# Patient Record
Sex: Male | Born: 1960 | Race: White | Hispanic: No | Marital: Married | Smoking: Never smoker
Health system: Southern US, Community
[De-identification: ages and names within clinical notes are randomized; demographics above are authoritative.]

## PROBLEM LIST (undated history)

## (undated) DIAGNOSIS — C439 Malignant melanoma of skin, unspecified: Secondary | ICD-10-CM

## (undated) DIAGNOSIS — I10 Essential (primary) hypertension: Secondary | ICD-10-CM

---

## 2016-06-09 ENCOUNTER — Emergency Department (HOSPITAL_COMMUNITY): Payer: Worker's Compensation

## 2016-06-09 ENCOUNTER — Emergency Department (HOSPITAL_COMMUNITY)
Admission: EM | Admit: 2016-06-09 | Discharge: 2016-06-09 | Disposition: A | Discharge status: Home / Self Care | Payer: Worker's Compensation | Attending: Emergency Medicine | Admitting: Emergency Medicine

## 2016-06-09 ENCOUNTER — Ambulatory Visit (HOSPITAL_COMMUNITY)
Admission: EM | Admit: 2016-06-09 | Discharge: 2016-06-09 | Disposition: A | Discharge status: Nursing Home (Includes ICF) | Payer: Worker's Compensation

## 2016-06-09 ENCOUNTER — Encounter (HOSPITAL_COMMUNITY): Payer: Self-pay | Admitting: *Deleted

## 2016-06-09 DIAGNOSIS — Z79899 Other long term (current) drug therapy: Secondary | ICD-10-CM | POA: Insufficient documentation

## 2016-06-09 DIAGNOSIS — Y929 Unspecified place or not applicable: Secondary | ICD-10-CM | POA: Insufficient documentation

## 2016-06-09 DIAGNOSIS — S301XXA Contusion of abdominal wall, initial encounter: Secondary | ICD-10-CM | POA: Diagnosis not present

## 2016-06-09 DIAGNOSIS — Y939 Activity, unspecified: Secondary | ICD-10-CM | POA: Diagnosis not present

## 2016-06-09 DIAGNOSIS — R1011 Right upper quadrant pain: Secondary | ICD-10-CM

## 2016-06-09 DIAGNOSIS — W1839XA Other fall on same level, initial encounter: Secondary | ICD-10-CM | POA: Diagnosis not present

## 2016-06-09 DIAGNOSIS — Y999 Unspecified external cause status: Secondary | ICD-10-CM | POA: Diagnosis not present

## 2016-06-09 DIAGNOSIS — T1490XA Injury, unspecified, initial encounter: Secondary | ICD-10-CM

## 2016-06-09 DIAGNOSIS — S3991XA Unspecified injury of abdomen, initial encounter: Secondary | ICD-10-CM | POA: Diagnosis present

## 2016-06-09 HISTORY — DX: Malignant melanoma of skin, unspecified: C43.9

## 2016-06-09 HISTORY — DX: Essential (primary) hypertension: I10

## 2016-06-09 LAB — CBC
HCT: 38.5 % — ABNORMAL LOW (ref 39.0–52.0)
HEMOGLOBIN: 13 g/dL (ref 13.0–17.0)
MCH: 28.4 pg (ref 26.0–34.0)
MCHC: 33.8 g/dL (ref 30.0–36.0)
MCV: 84.2 fL (ref 78.0–100.0)
Platelets: 280 10*3/uL (ref 150–400)
RBC: 4.57 MIL/uL (ref 4.22–5.81)
RDW: 13.8 % (ref 11.5–15.5)
WBC: 12 10*3/uL — ABNORMAL HIGH (ref 4.0–10.5)

## 2016-06-09 LAB — PROTIME-INR
INR: 1.03
Prothrombin Time: 13.5 seconds (ref 11.4–15.2)

## 2016-06-09 LAB — COMPREHENSIVE METABOLIC PANEL
ALT: 20 U/L (ref 17–63)
ANION GAP: 16 — AB (ref 5–15)
AST: 65 U/L — ABNORMAL HIGH (ref 15–41)
Albumin: 4.8 g/dL (ref 3.5–5.0)
Alkaline Phosphatase: 60 U/L (ref 38–126)
BUN: 19 mg/dL (ref 6–20)
CHLORIDE: 105 mmol/L (ref 101–111)
CO2: 17 mmol/L — AB (ref 22–32)
Calcium: 9.9 mg/dL (ref 8.9–10.3)
Creatinine, Ser: 1.41 mg/dL — ABNORMAL HIGH (ref 0.61–1.24)
GFR calc non Af Amer: 55 mL/min — ABNORMAL LOW (ref >60)
Glucose, Bld: 103 mg/dL — ABNORMAL HIGH (ref 65–99)
Potassium: 3.9 mmol/L (ref 3.5–5.1)
Sodium: 138 mmol/L (ref 135–145)
Total Bilirubin: 1 mg/dL (ref 0.3–1.2)
Total Protein: 8.3 g/dL — ABNORMAL HIGH (ref 6.5–8.1)

## 2016-06-09 LAB — I-STAT CHEM 8, ED
BUN: 22 mg/dL — ABNORMAL HIGH (ref 6–20)
CALCIUM ION: 1.15 mmol/L (ref 1.15–1.40)
Chloride: 107 mmol/L (ref 101–111)
Creatinine, Ser: 1.3 mg/dL — ABNORMAL HIGH (ref 0.61–1.24)
GLUCOSE: 105 mg/dL — AB (ref 65–99)
HCT: 40 % (ref 39.0–52.0)
HEMOGLOBIN: 13.6 g/dL (ref 13.0–17.0)
Potassium: 3.9 mmol/L (ref 3.5–5.1)
Sodium: 139 mmol/L (ref 135–145)
TCO2: 20 mmol/L (ref 0–100)

## 2016-06-09 LAB — SAMPLE TO BLOOD BANK

## 2016-06-09 LAB — ETHANOL

## 2016-06-09 LAB — I-STAT CG4 LACTIC ACID, ED: Lactic Acid, Venous: 1.12 mmol/L (ref 0.5–1.9)

## 2016-06-09 MED ORDER — HYDROCODONE-ACETAMINOPHEN 5-325 MG PO TABS: 2.0000 | ORAL_TABLET | Freq: Four times a day (QID) | ORAL | 0 refills | Status: AC | PRN

## 2016-06-09 MED ORDER — SODIUM CHLORIDE 0.9 % IV SOLN
Freq: Once | INTRAVENOUS | Status: AC
  Administered 2016-06-09: 21:00:00 via INTRAVENOUS

## 2016-06-09 MED ORDER — MORPHINE SULFATE (PF) 4 MG/ML IV SOLN
INTRAVENOUS | Status: AC
  Administered 2016-06-09: 10 mg
  Filled 2016-06-09: qty 3

## 2016-06-09 MED ORDER — IOPAMIDOL (ISOVUE-300) INJECTION 61%
100.0000 mL | Freq: Once | INTRAVENOUS | Status: AC | PRN
  Administered 2016-06-09: 100 mL via INTRAVENOUS

## 2016-06-09 MED ORDER — MORPHINE SULFATE (PF) 10 MG/ML IV SOLN: 10.0000 mg | Freq: Once | INTRAVENOUS | Status: DC

## 2016-06-09 MED ORDER — IOPAMIDOL (ISOVUE-300) INJECTION 61%
INTRAVENOUS | Status: AC
  Filled 2016-06-09: qty 100

## 2016-06-09 NOTE — ED Triage Notes (Signed)
Pt  Reports   Pain  r    Rib    Cage   Area    Fell    Through   In  r  Upper   Quadrant  Pain      Pain  Pt  Has   Pain   And  Swelling  r   Upper  Quadrant   With    some   Swelling  And  Bruising  Noted     Pt  Has   Bruise   And  Tenderness      He  Is   Awake   And  Alert

## 2016-06-09 NOTE — ED Notes (Signed)
Report  On pt  Phoned  To  [place]

## 2016-06-09 NOTE — ED Provider Notes (Addendum)
Seen on arrival to room Patient fell through a grate landing on  right flank immediately prior to arrival. Denies any shortness of breath complains of right flank pain. No other associated symptoms. On exam patient is alert Glasgow Coma Score 15 appears uncomfortable heart tachycardic regular rhythm lungs clear auscultation chest nontender. Abdomen obese, nontender. He has a baseball size hematoma overlying right flank with corresponding tenderness. Right upper extremity with abrasion and volar wrist. Neurovascular intact. All other extremities or contusion abrasion or tenderness neurovascularly intact. Pelvis stable nontender.entire spine nontender  9:05 PM pain improved after treatment with intravenous morphine   Orlie Dakin, MD 06/09/16 [id number]    Orlie Dakin, MD 10/05/16 1139

## 2016-06-09 NOTE — Discharge Instructions (Signed)
Please go to the ED

## 2016-06-09 NOTE — ED Provider Notes (Signed)
CSN: [id number]     Arrival date & time 06/09/16  1924 History   None    Chief Complaint  Patient presents with  . Rib Injury   (Consider location/radiation/quality/duration/timing/severity/associated sxs/prior Treatment) Patient c/o right upper quadrant abdominal pain and right rib pain after falling and hitting right abdomen into a steel crate.   The history is provided by the patient.  Abdominal Pain  Pain location:  RUQ Pain quality: aching   Pain radiates to:  RUQ Pain severity:  Severe Onset quality:  Sudden Duration:  1 hour Timing:  Constant Progression:  Worsening Worsened by:  Nothing Ineffective treatments:  None tried   Past Medical History:  Diagnosis Date  . Hypertension   . Melanoma of multiple sites [facility])    History reviewed. No pertinent surgical history. No family history on file. Social History  Substance Use Topics  . Smoking status: Never Smoker  . Smokeless tobacco: Not on file  . Alcohol use No    Review of Systems  Constitutional: Negative.   HENT: Negative.   Eyes: Negative.   Cardiovascular: Negative.   Gastrointestinal: Positive for abdominal pain.  Endocrine: Negative.   Genitourinary: Negative.   Musculoskeletal: Negative.   Allergic/Immunologic: Negative.   Hematological: Negative.   Psychiatric/Behavioral: Negative.     Allergies  Patient has no known allergies.  Home Medications   Prior to Admission medications   Medication Sig Start Date End Date Taking? Authorizing Provider  BUPROPION HCL PO Take by mouth.   Yes Historical Provider, MD  FINASTERIDE PO Take by mouth.   Yes Historical Provider, MD  HYDROCHLOROTHIAZIDE PO Take by mouth.   Yes Historical Provider, MD  NIACIN ER PO Take by mouth.   Yes Historical Provider, MD  TERAZOSIN HCL PO Take by mouth.   Yes Historical Provider, MD   Meds Ordered and Administered this Visit  Medications - No data to display  BP (!) 148/82 (BP Location: Left Arm)   Pulse (!) 124    Temp 98.6 F (37 C) (Oral)   Resp 18   SpO2 100%  No data found.   Physical Exam  Constitutional: He is oriented to person, place, and time. He appears well-developed and well-nourished.  HENT:  Head: Normocephalic and atraumatic.  Eyes: Conjunctivae and EOM are normal. Pupils are equal, round, and reactive to light.  Neck: Normal range of motion. Neck supple.  Cardiovascular: Normal rate, regular rhythm and normal heart sounds.   Pulmonary/Chest: Effort normal.  BBS diminished  Abdominal: There is tenderness.  Hematoma right upper lateral abdomen approx 8 cm diameter and tender in RUQ and right chest.  Guarding with palpation right abdomen.  Musculoskeletal: He exhibits tenderness.  Tenderness with palpation right lower ribs anterior and lateral.  No crepitus or paradoxical movement.  Neurological: He is alert and oriented to person, place, and time.  Nursing note and vitals reviewed.   Urgent Care Course     Procedures (including critical care time)  Labs Review Labs Reviewed - No data to display  Imaging Review No results found.   Visual Acuity Review  Right Eye Distance:   Left Eye Distance:   Bilateral Distance:    Right Eye Near:   Left Eye Near:    Bilateral Near:         MDM   1. Right upper quadrant abdominal pain    Advised patient to go to ED for blunt trauma to abdomen And right lower chest wall.  Lysbeth Penner, FNP 06/09/16 2022

## 2016-06-09 NOTE — ED Notes (Signed)
Patient transported to CT scan . 

## 2016-06-09 NOTE — Progress Notes (Signed)
Orthopedic Tech Progress Note Patient Details:  Stephen Hahn Nov 27, 1960 [id number]  Ortho Devices Ortho Device/Splint Location: Ortho tech present at level 2 trauma.  Ortho tech not needed at this time.    Kristopher Oppenheim 06/09/2016, 8:49 PM

## 2016-06-10 LAB — CDS SEROLOGY

## 2017-10-07 IMAGING — CT CT ABD-PELV W/ CM
2 of 5 series · 17 of 46 positions shown, 19 images · IV contrast (Omni 300)
Comparison: None.

CLINICAL DATA: Blunt trauma to the right lateral abdomen.
Persistent pain.

EXAM:
CT ABDOMEN AND PELVIS WITH CONTRAST
TECHNIQUE: Multidetector CT imaging of the abdomen and pelvis was performed
using the standard protocol following bolus administration of
intravenous contrast.
CONTRAST:  100mL PQMF8N-8PP IOPAMIDOL (PQMF8N-8PP) INJECTION 61%

[Series 3: a/p w/ 5mm · axial · 0.98mm/px · z∈[-438,-28]mm · 14 of 94 slices shown, 16 images]
[im 6/94  soft-tissue]
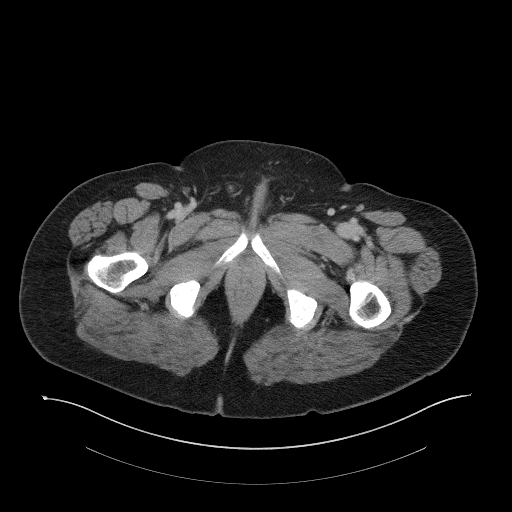
[im 6/94  bone]
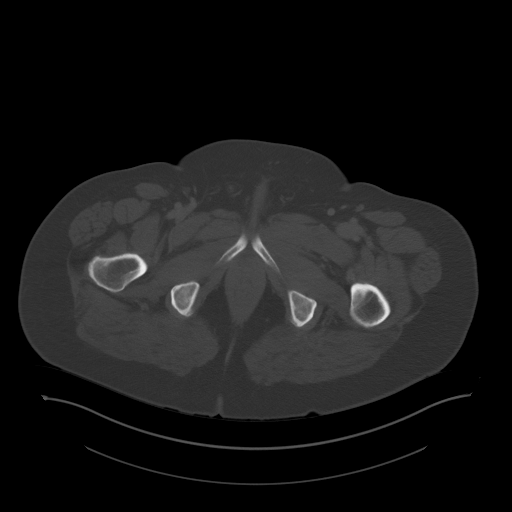
[im 11/94  soft-tissue]
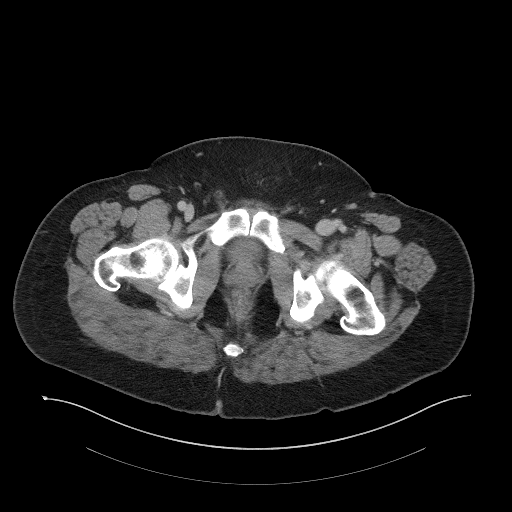
[im 21/94  soft-tissue]
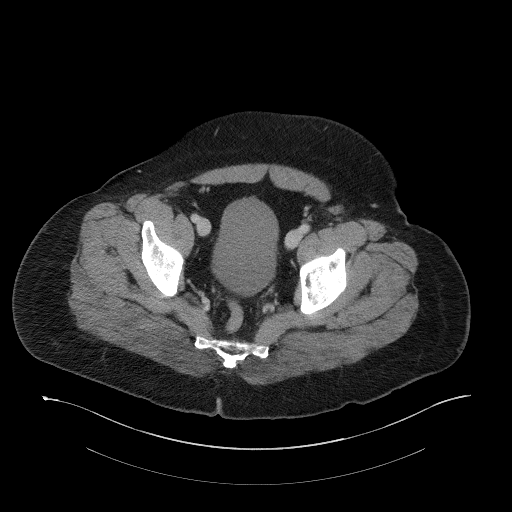
[im 26/94  soft-tissue]
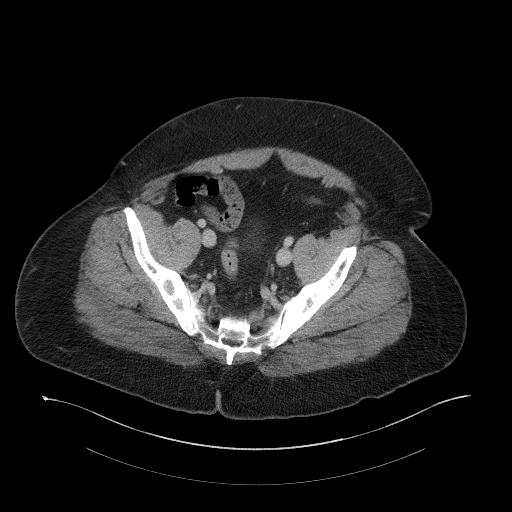
[im 32/94  soft-tissue]
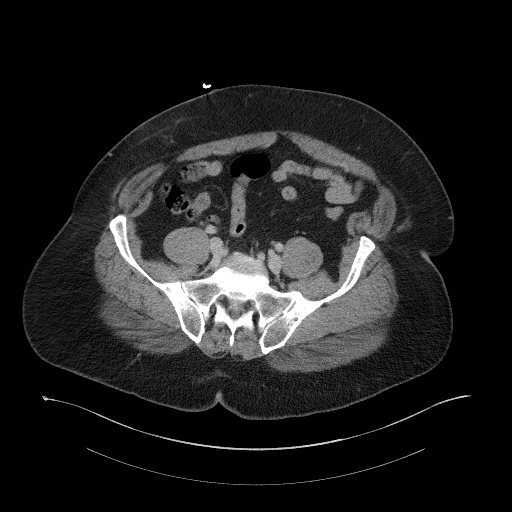
[im 37/94  soft-tissue]
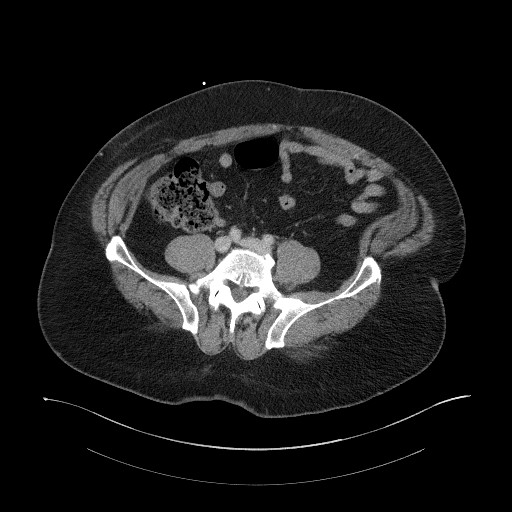
[im 42/94  soft-tissue]
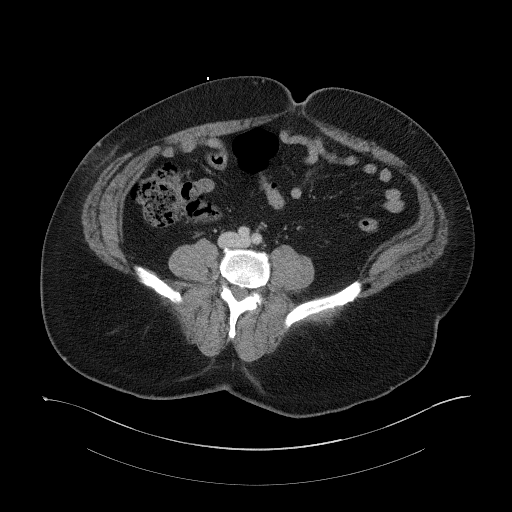
[im 52/94  soft-tissue]
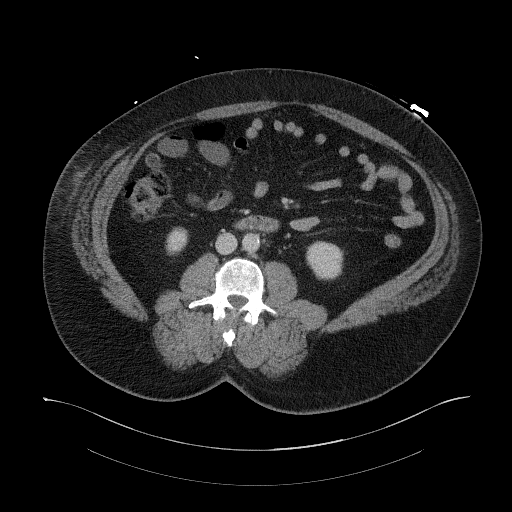
[im 57/94  soft-tissue]
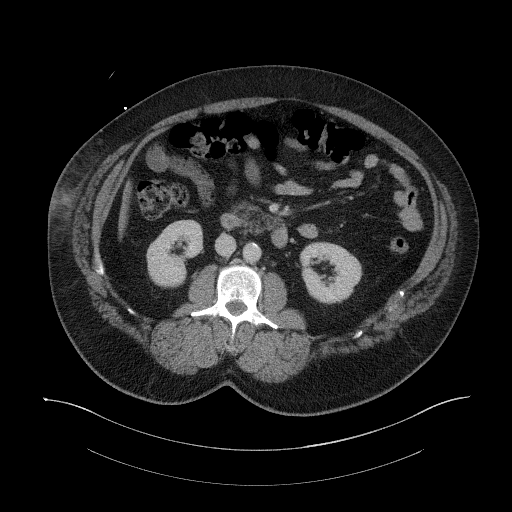
[im 57/94  bone]
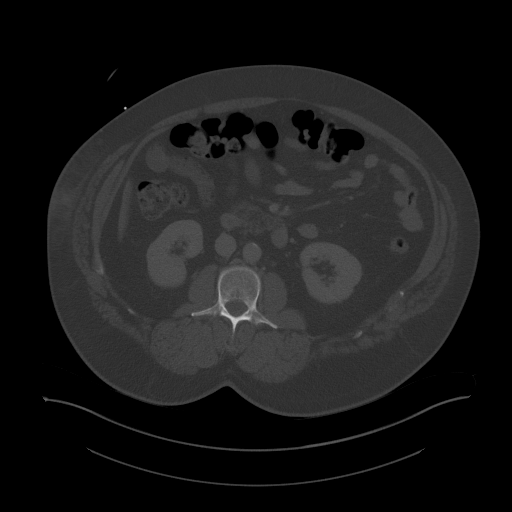
[im 63/94  soft-tissue]
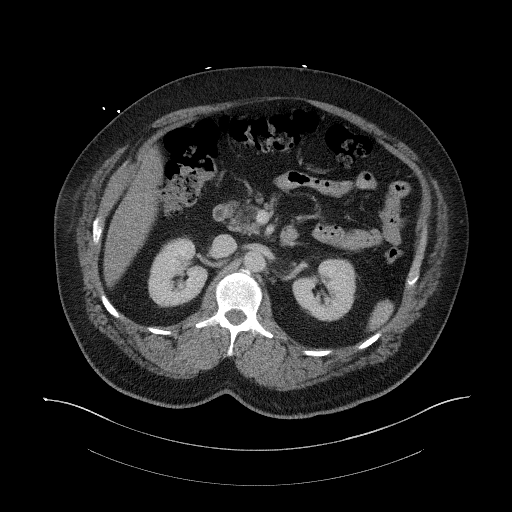
[im 68/94  soft-tissue]
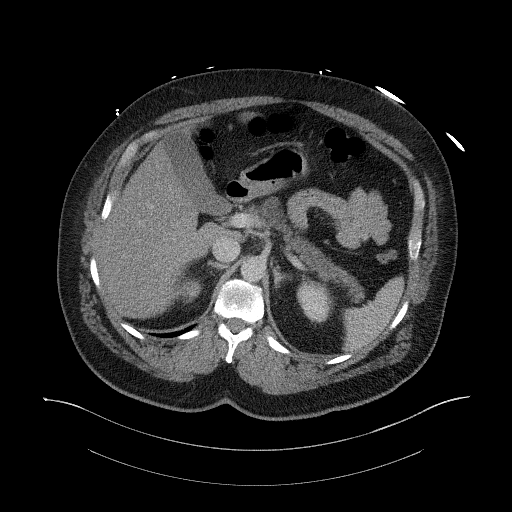
[im 73/94  soft-tissue]
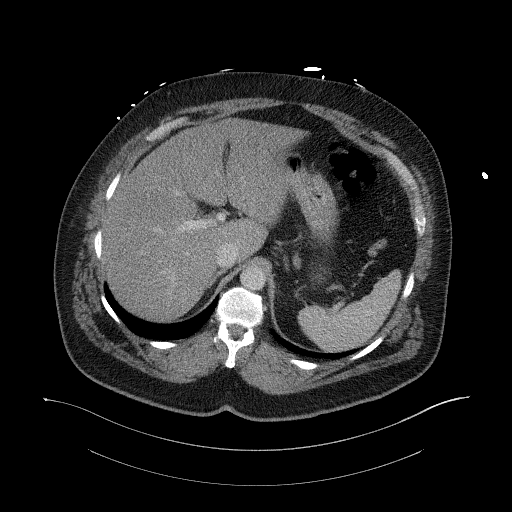
[im 83/94  soft-tissue]
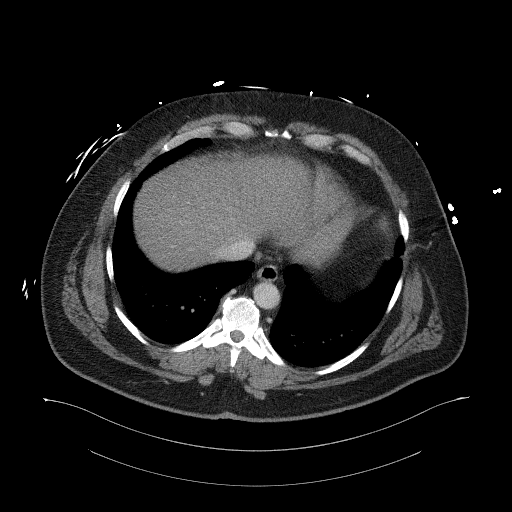
[im 88/94  soft-tissue]
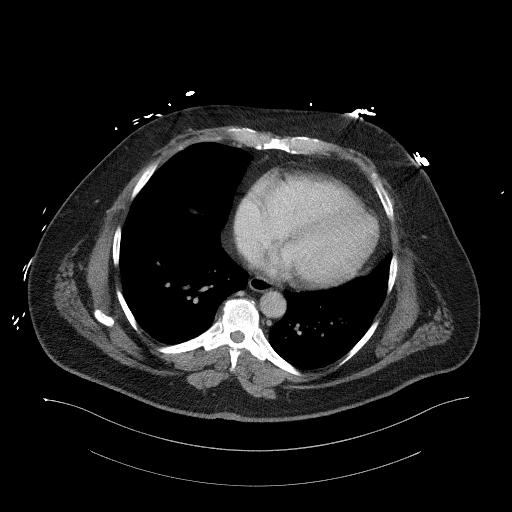

[Series 6: a/p w/ cor · coronal · 0.91mm/px · 3 of 184 slices shown]
[im 62/184  soft-tissue]
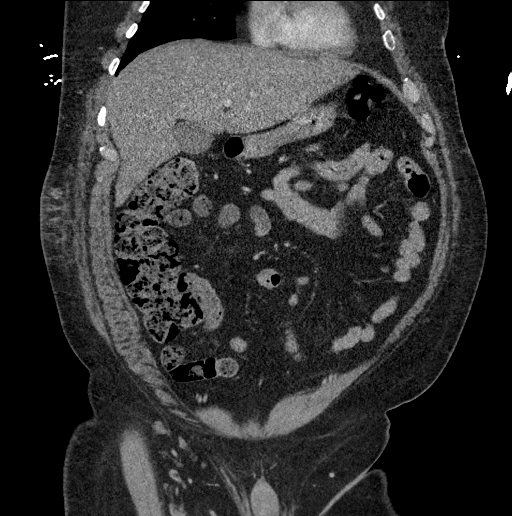
[im 82/184  soft-tissue]
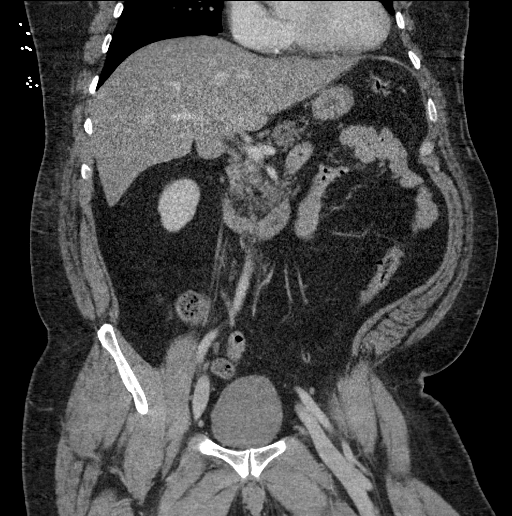
[im 102/184  soft-tissue]
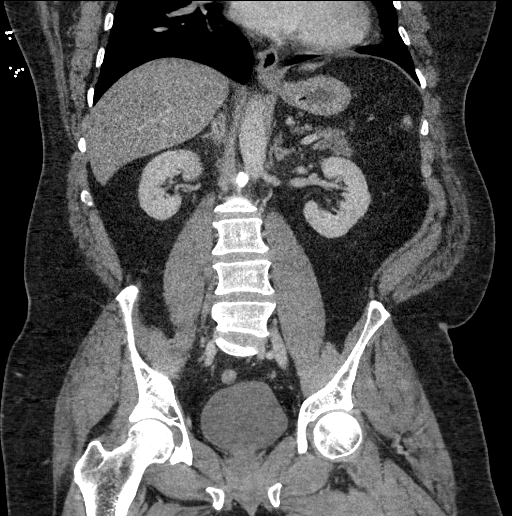

[17 of 46 positions shown; findings below may reference images not displayed]

FINDINGS: Lower chest: No acute abnormality.

Hepatobiliary: Slight fatty infiltration of the liver. No focal
liver lesion. Gallbladder and bile ducts are normal.

Pancreas: Unremarkable. No pancreatic ductal dilatation or
surrounding inflammatory changes.

Spleen: Normal in size without focal abnormality.

Adrenals/Urinary Tract: Adrenal glands are unremarkable. Kidneys are
normal, without renal calculi, focal lesion, or hydronephrosis.
Bladder is unremarkable.

Stomach/Bowel: Stomach is within normal limits. Appendix appears
normal. No evidence of bowel wall thickening, distention, or
inflammatory changes.

Vascular/Lymphatic: No significant vascular findings are present. No
enlarged abdominal or pelvic lymph nodes.

Reproductive: Unremarkable

Other: Subcutaneous edema in the right lateral abdominal wall
consistent with a soft tissue contusion. No large drainable
hematoma.

Musculoskeletal: No significant skeletal lesion. No evidence of
acute fracture.
IMPRESSION: Subcutaneous edema consistent with soft tissue contusion, right
lateral abdominal wall. No drainable hematoma. No evidence of
significant traumatic injury within the abdomen or pelvis. Slight
hepatic steatosis.
